# Patient Record
Sex: Male | Born: 1955 | Race: White | Hispanic: No | Smoking: Current every day smoker
Health system: Southern US, Community
[De-identification: ages and names within clinical notes are randomized; demographics above are authoritative.]

## PROBLEM LIST (undated history)

## (undated) DIAGNOSIS — I2510 Atherosclerotic heart disease of native coronary artery without angina pectoris: Secondary | ICD-10-CM

## (undated) DIAGNOSIS — F329 Major depressive disorder, single episode, unspecified: Secondary | ICD-10-CM

## (undated) DIAGNOSIS — F32A Depression, unspecified: Secondary | ICD-10-CM

## (undated) HISTORY — PX: CORONARY ARTERY BYPASS GRAFT: SHX141

---

## 2015-10-04 ENCOUNTER — Emergency Department: Payer: BLUE CROSS/BLUE SHIELD

## 2015-10-04 ENCOUNTER — Inpatient Hospital Stay
Admission: EM | Admit: 2015-10-04 | Discharge: 2015-10-07 | DRG: 885 | Disposition: A | Discharge status: Other Acute Inpatient Hospital | Payer: BLUE CROSS/BLUE SHIELD | Attending: Internal Medicine | Admitting: Internal Medicine

## 2015-10-04 DIAGNOSIS — F1010 Alcohol abuse, uncomplicated: Secondary | ICD-10-CM | POA: Diagnosis present

## 2015-10-04 DIAGNOSIS — R000 Tachycardia, unspecified: Secondary | ICD-10-CM | POA: Diagnosis present

## 2015-10-04 DIAGNOSIS — G2579 Other drug induced movement disorders: Secondary | ICD-10-CM | POA: Diagnosis present

## 2015-10-04 DIAGNOSIS — N179 Acute kidney failure, unspecified: Secondary | ICD-10-CM | POA: Diagnosis present

## 2015-10-04 DIAGNOSIS — T43221A Poisoning by selective serotonin reuptake inhibitors, accidental (unintentional), initial encounter: Secondary | ICD-10-CM | POA: Diagnosis present

## 2015-10-04 DIAGNOSIS — R292 Abnormal reflex: Secondary | ICD-10-CM | POA: Diagnosis present

## 2015-10-04 DIAGNOSIS — Z880 Allergy status to penicillin: Secondary | ICD-10-CM | POA: Diagnosis not present

## 2015-10-04 DIAGNOSIS — Z951 Presence of aortocoronary bypass graft: Secondary | ICD-10-CM | POA: Diagnosis not present

## 2015-10-04 DIAGNOSIS — F3113 Bipolar disorder, current episode manic without psychotic features, severe: Secondary | ICD-10-CM | POA: Diagnosis not present

## 2015-10-04 DIAGNOSIS — E785 Hyperlipidemia, unspecified: Secondary | ICD-10-CM | POA: Diagnosis present

## 2015-10-04 DIAGNOSIS — F319 Bipolar disorder, unspecified: Secondary | ICD-10-CM | POA: Diagnosis not present

## 2015-10-04 DIAGNOSIS — I2510 Atherosclerotic heart disease of native coronary artery without angina pectoris: Secondary | ICD-10-CM | POA: Diagnosis present

## 2015-10-04 DIAGNOSIS — T50901A Poisoning by unspecified drugs, medicaments and biological substances, accidental (unintentional), initial encounter: Secondary | ICD-10-CM

## 2015-10-04 DIAGNOSIS — E876 Hypokalemia: Secondary | ICD-10-CM | POA: Diagnosis present

## 2015-10-04 DIAGNOSIS — E860 Dehydration: Secondary | ICD-10-CM | POA: Diagnosis present

## 2015-10-04 DIAGNOSIS — F29 Unspecified psychosis not due to a substance or known physiological condition: Secondary | ICD-10-CM

## 2015-10-04 DIAGNOSIS — F17200 Nicotine dependence, unspecified, uncomplicated: Secondary | ICD-10-CM | POA: Diagnosis present

## 2015-10-04 DIAGNOSIS — G9081 Serotonin syndrome: Secondary | ICD-10-CM | POA: Diagnosis present

## 2015-10-04 DIAGNOSIS — Z79899 Other long term (current) drug therapy: Secondary | ICD-10-CM | POA: Diagnosis not present

## 2015-10-04 DIAGNOSIS — E7800 Pure hypercholesterolemia, unspecified: Secondary | ICD-10-CM | POA: Diagnosis present

## 2015-10-04 DIAGNOSIS — I10 Essential (primary) hypertension: Secondary | ICD-10-CM | POA: Diagnosis present

## 2015-10-04 HISTORY — DX: Depression, unspecified: F32.A

## 2015-10-04 HISTORY — DX: Atherosclerotic heart disease of native coronary artery without angina pectoris: I25.10

## 2015-10-04 HISTORY — DX: Major depressive disorder, single episode, unspecified: F32.9

## 2015-10-04 LAB — CBC WITH DIFFERENTIAL/PLATELET
BASOS PCT: 0 %
Basophils Absolute: 0.1 10*3/uL (ref 0–0.1)
EOS ABS: 0 10*3/uL (ref 0–0.7)
Eosinophils Relative: 0 %
HCT: 46 % (ref 40.0–52.0)
HEMOGLOBIN: 16 g/dL (ref 13.0–18.0)
Lymphocytes Relative: 5 %
Lymphs Abs: 0.9 10*3/uL — ABNORMAL LOW (ref 1.0–3.6)
MCH: 31.6 pg (ref 26.0–34.0)
MCHC: 34.8 g/dL (ref 32.0–36.0)
MCV: 91 fL (ref 80.0–100.0)
Monocytes Absolute: 1.2 10*3/uL — ABNORMAL HIGH (ref 0.2–1.0)
Monocytes Relative: 7 %
NEUTROS PCT: 88 %
Neutro Abs: 14.6 10*3/uL — ABNORMAL HIGH (ref 1.4–6.5)
PLATELETS: 186 10*3/uL (ref 150–440)
RBC: 5.06 MIL/uL (ref 4.40–5.90)
RDW: 13.6 % (ref 11.5–14.5)
WBC: 16.8 10*3/uL — AB (ref 3.8–10.6)

## 2015-10-04 LAB — URINALYSIS COMPLETE WITH MICROSCOPIC (ARMC ONLY)
Bilirubin Urine: NEGATIVE
GLUCOSE, UA: NEGATIVE mg/dL
Ketones, ur: NEGATIVE mg/dL
NITRITE: NEGATIVE
PH: 5 (ref 5.0–8.0)
PROTEIN: 30 mg/dL — AB
Specific Gravity, Urine: 1.014 (ref 1.005–1.030)

## 2015-10-04 LAB — COMPREHENSIVE METABOLIC PANEL
ALK PHOS: 128 U/L — AB (ref 38–126)
ALT: 39 U/L (ref 17–63)
ANION GAP: 14 (ref 5–15)
AST: 48 U/L — ABNORMAL HIGH (ref 15–41)
Albumin: 5.4 g/dL — ABNORMAL HIGH (ref 3.5–5.0)
BILIRUBIN TOTAL: 2.5 mg/dL — AB (ref 0.3–1.2)
BUN: 30 mg/dL — ABNORMAL HIGH (ref 6–20)
CALCIUM: 10.4 mg/dL — AB (ref 8.9–10.3)
CO2: 24 mmol/L (ref 22–32)
Chloride: 101 mmol/L (ref 101–111)
Creatinine, Ser: 1.91 mg/dL — ABNORMAL HIGH (ref 0.61–1.24)
GFR, EST AFRICAN AMERICAN: 43 mL/min — AB (ref >60)
GFR, EST NON AFRICAN AMERICAN: 37 mL/min — AB (ref >60)
Glucose, Bld: 166 mg/dL — ABNORMAL HIGH (ref 65–99)
Potassium: 2.9 mmol/L — CL (ref 3.5–5.1)
Sodium: 139 mmol/L (ref 135–145)
TOTAL PROTEIN: 8.8 g/dL — AB (ref 6.5–8.1)

## 2015-10-04 LAB — URINE DRUG SCREEN, QUALITATIVE (ARMC ONLY)
AMPHETAMINES, UR SCREEN: NOT DETECTED
BENZODIAZEPINE, UR SCRN: NOT DETECTED
Barbiturates, Ur Screen: NOT DETECTED
CANNABINOID 50 NG, UR ~~LOC~~: NOT DETECTED
Cocaine Metabolite,Ur ~~LOC~~: NOT DETECTED
MDMA (ECSTASY) UR SCREEN: NOT DETECTED
Methadone Scn, Ur: NOT DETECTED
Opiate, Ur Screen: NOT DETECTED
Phencyclidine (PCP) Ur S: NOT DETECTED
TRICYCLIC, UR SCREEN: NOT DETECTED

## 2015-10-04 LAB — ETHANOL: Alcohol, Ethyl (B): <5 mg/dL (ref <5)

## 2015-10-04 LAB — PROTIME-INR
INR: 1.07
PROTHROMBIN TIME: 14.1 s (ref 11.4–15.0)

## 2015-10-04 LAB — SALICYLATE LEVEL

## 2015-10-04 LAB — APTT: aPTT: 34 seconds (ref 24–36)

## 2015-10-04 LAB — CK: Total CK: 167 U/L (ref 49–397)

## 2015-10-04 LAB — AMMONIA: AMMONIA: 26 umol/L (ref 9–35)

## 2015-10-04 LAB — ACETAMINOPHEN LEVEL: Acetaminophen (Tylenol), Serum: <10 ug/mL — ABNORMAL LOW (ref 10–30)

## 2015-10-04 MED ORDER — POTASSIUM CHLORIDE CRYS ER 20 MEQ PO TBCR: 40.0000 meq | EXTENDED_RELEASE_TABLET | Freq: Once | ORAL | Status: DC

## 2015-10-04 MED ORDER — POTASSIUM CHLORIDE CRYS ER 20 MEQ PO TBCR
40.0000 meq | EXTENDED_RELEASE_TABLET | Freq: Once | ORAL | Status: AC
  Administered 2015-10-04: 40 meq via ORAL
  Filled 2015-10-04: qty 2

## 2015-10-04 MED ORDER — ACETAMINOPHEN 325 MG PO TABS
650.0000 mg | ORAL_TABLET | Freq: Once | ORAL | Status: AC
  Administered 2015-10-04: 650 mg via ORAL
  Filled 2015-10-04: qty 2

## 2015-10-04 MED ORDER — LORAZEPAM 2 MG/ML IJ SOLN: 1.0000 mg | Freq: Once | INTRAMUSCULAR | Status: DC

## 2015-10-04 MED ORDER — SODIUM CHLORIDE 0.9 % IV BOLUS (SEPSIS)
500.0000 mL | Freq: Once | INTRAVENOUS | Status: AC
  Administered 2015-10-04: 500 mL via INTRAVENOUS

## 2015-10-04 MED ORDER — POTASSIUM CHLORIDE CRYS ER 20 MEQ PO TBCR
40.0000 meq | EXTENDED_RELEASE_TABLET | Freq: Once | ORAL | Status: DC
  Filled 2015-10-04: qty 2
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
## 2015-10-04 NOTE — ED Notes (Signed)
Assisted pt to a standing position so pt can void. Significant increase in HR noted upon standing (see flowsheet). Alphonzo LemmingsMcShane, MD notified.
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
## 2015-10-05 DIAGNOSIS — F1010 Alcohol abuse, uncomplicated: Secondary | ICD-10-CM

## 2015-10-05 DIAGNOSIS — F3113 Bipolar disorder, current episode manic without psychotic features, severe: Secondary | ICD-10-CM

## 2015-10-05 DIAGNOSIS — T50901A Poisoning by unspecified drugs, medicaments and biological substances, accidental (unintentional), initial encounter: Secondary | ICD-10-CM

## 2015-10-05 LAB — COMPREHENSIVE METABOLIC PANEL
ALK PHOS: 114 U/L (ref 38–126)
ALT: 34 U/L (ref 17–63)
AST: 66 U/L — AB (ref 15–41)
Albumin: 4.6 g/dL (ref 3.5–5.0)
Anion gap: 10 (ref 5–15)
BUN: 30 mg/dL — AB (ref 6–20)
CHLORIDE: 101 mmol/L (ref 101–111)
CO2: 29 mmol/L (ref 22–32)
CREATININE: 1.39 mg/dL — AB (ref 0.61–1.24)
Calcium: 9.2 mg/dL (ref 8.9–10.3)
GFR calc Af Amer: >60 mL/min (ref >60)
GFR, EST NON AFRICAN AMERICAN: 54 mL/min — AB (ref >60)
Glucose, Bld: 167 mg/dL — ABNORMAL HIGH (ref 65–99)
Potassium: 2.8 mmol/L — CL (ref 3.5–5.1)
Sodium: 140 mmol/L (ref 135–145)
Total Bilirubin: 1.8 mg/dL — ABNORMAL HIGH (ref 0.3–1.2)
Total Protein: 7.4 g/dL (ref 6.5–8.1)

## 2015-10-05 LAB — CBC
HEMATOCRIT: 41.3 % (ref 40.0–52.0)
HEMOGLOBIN: 14.3 g/dL (ref 13.0–18.0)
MCH: 32.1 pg (ref 26.0–34.0)
MCHC: 34.7 g/dL (ref 32.0–36.0)
MCV: 92.5 fL (ref 80.0–100.0)
PLATELETS: 157 10*3/uL (ref 150–440)
RBC: 4.46 MIL/uL (ref 4.40–5.90)
RDW: 13.6 % (ref 11.5–14.5)
WBC: 13.9 10*3/uL — AB (ref 3.8–10.6)

## 2015-10-05 LAB — MAGNESIUM: MAGNESIUM: 1.7 mg/dL (ref 1.7–2.4)

## 2015-10-05 LAB — PROTIME-INR
INR: 1.12
Prothrombin Time: 14.6 seconds (ref 11.4–15.0)

## 2015-10-05 LAB — POTASSIUM: Potassium: 3.4 mmol/L — ABNORMAL LOW (ref 3.5–5.1)

## 2015-10-05 LAB — GLUCOSE, CAPILLARY: GLUCOSE-CAPILLARY: 103 mg/dL — AB (ref 65–99)

## 2015-10-05 LAB — APTT: aPTT: 35 seconds (ref 24–36)

## 2015-10-05 MED ORDER — SODIUM CHLORIDE 0.9% FLUSH
3.0000 mL | Freq: Two times a day (BID) | INTRAVENOUS | Status: DC
  Administered 2015-10-05 (×2): 3 mL via INTRAVENOUS

## 2015-10-05 MED ORDER — POTASSIUM CHLORIDE CRYS ER 20 MEQ PO TBCR
40.0000 meq | EXTENDED_RELEASE_TABLET | Freq: Once | ORAL | Status: AC
  Administered 2015-10-05: 40 meq via ORAL
  Filled 2015-10-05: qty 2

## 2015-10-05 MED ORDER — HEPARIN SODIUM (PORCINE) 5000 UNIT/ML IJ SOLN
5000.0000 [IU] | Freq: Three times a day (TID) | INTRAMUSCULAR | Status: DC
  Administered 2015-10-05 – 2015-10-06 (×4): 5000 [IU] via SUBCUTANEOUS
  Filled 2015-10-05 (×4): qty 1

## 2015-10-05 MED ORDER — NAPHAZOLINE-GLYCERIN 0.012-0.2 % OP SOLN
1.0000 [drp] | Freq: Four times a day (QID) | OPHTHALMIC | Status: DC | PRN
  Administered 2015-10-05 – 2015-10-06 (×3): 2 [drp] via OPHTHALMIC
  Administered 2015-10-06: 1 [drp] via OPHTHALMIC
  Filled 2015-10-05: qty 15

## 2015-10-05 MED ORDER — ACETAMINOPHEN 325 MG PO TABS
650.0000 mg | ORAL_TABLET | Freq: Four times a day (QID) | ORAL | Status: DC | PRN
  Administered 2015-10-06: 650 mg via ORAL
  Filled 2015-10-05: qty 2

## 2015-10-05 MED ORDER — POTASSIUM CHLORIDE IN NACL 20-0.9 MEQ/L-% IV SOLN
INTRAVENOUS | Status: DC
  Administered 2015-10-05 – 2015-10-06 (×5): via INTRAVENOUS
  Filled 2015-10-05 (×6): qty 1000

## 2015-10-05 MED ORDER — DOCUSATE SODIUM 100 MG PO CAPS
100.0000 mg | ORAL_CAPSULE | Freq: Two times a day (BID) | ORAL | Status: DC
  Administered 2015-10-05 – 2015-10-06 (×5): 100 mg via ORAL
  Filled 2015-10-05 (×5): qty 1

## 2015-10-05 MED ORDER — ONDANSETRON HCL 4 MG PO TABS: 4.0000 mg | ORAL_TABLET | Freq: Four times a day (QID) | ORAL | Status: DC | PRN

## 2015-10-05 MED ORDER — ACETAMINOPHEN 650 MG RE SUPP: 650.0000 mg | Freq: Four times a day (QID) | RECTAL | Status: DC | PRN

## 2015-10-05 MED ORDER — SODIUM CHLORIDE 0.9 % IV SOLN
INTRAVENOUS | Status: DC
  Administered 2015-10-05: 02:00:00 via INTRAVENOUS

## 2015-10-05 MED ORDER — LORAZEPAM 2 MG/ML IJ SOLN
1.0000 mg | Freq: Four times a day (QID) | INTRAMUSCULAR | Status: DC | PRN
  Administered 2015-10-06 (×3): 1 mg via INTRAVENOUS
  Filled 2015-10-05 (×3): qty 1

## 2015-10-05 MED ORDER — ONDANSETRON HCL 4 MG/2ML IJ SOLN: 4.0000 mg | Freq: Four times a day (QID) | INTRAMUSCULAR | Status: DC | PRN

## 2015-10-05 MED ORDER — POTASSIUM CHLORIDE CRYS ER 20 MEQ PO TBCR
40.0000 meq | EXTENDED_RELEASE_TABLET | Freq: Once | ORAL | Status: AC
Start: 2015-10-05 — End: 2015-10-05
  Administered 2015-10-05: 40 meq via ORAL

## 2015-10-05 NOTE — Consult Note (Signed)
  Psychiatry: Brief note. Full note to follow. 60 year old man presented with a constellation of symptoms that includes elevated pulse blood pressure and temperature although all mildly and all of them resolving, diaphoresis, agitation. Admits to overdosing on Zoloft. Differential diagnosis includes serotonin syndrome, mania, alcohol withdrawal or what I suspect which is that there may be contributions of all 3. Patient currently is hemodynamically stable. We'll continue to monitor. Full note to follow

## 2015-10-05 NOTE — Progress Notes (Signed)
Pt. Complained of watering, itchy eyes, MD notified and orders placed.
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
## 2015-10-05 NOTE — Progress Notes (Signed)
Pt. Potassium critical at 2.9. MD notified and 40 mEq ordered and admin.
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
## 2015-10-05 NOTE — ED Notes (Addendum)
Previous note documented at 0021 was entered in error

## 2015-10-06 LAB — GLUCOSE, CAPILLARY: GLUCOSE-CAPILLARY: 109 mg/dL — AB (ref 65–99)

## 2015-10-06 MED ORDER — ENOXAPARIN SODIUM 40 MG/0.4ML ~~LOC~~ SOLN
40.0000 mg | SUBCUTANEOUS | Status: DC
  Administered 2015-10-06: 40 mg via SUBCUTANEOUS
  Filled 2015-10-06: qty 0.4

## 2015-10-06 MED ORDER — LORAZEPAM 1 MG PO TABS
1.0000 mg | ORAL_TABLET | Freq: Four times a day (QID) | ORAL | Status: DC | PRN
  Administered 2015-10-06: 1 mg via ORAL
  Filled 2015-10-06: qty 1

## 2015-10-06 MED ORDER — DIVALPROEX SODIUM 500 MG PO DR TAB: 500.0000 mg | DELAYED_RELEASE_TABLET | Freq: Two times a day (BID) | ORAL | Status: DC

## 2015-10-06 MED ORDER — ALPRAZOLAM 0.25 MG PO TABS
0.2500 mg | ORAL_TABLET | Freq: Three times a day (TID) | ORAL | Status: AC | PRN
Start: 2015-10-06 — End: ?

## 2015-10-06 MED ORDER — DIVALPROEX SODIUM 500 MG PO DR TAB
500.0000 mg | DELAYED_RELEASE_TABLET | Freq: Two times a day (BID) | ORAL | Status: DC
  Administered 2015-10-06: 500 mg via ORAL
  Filled 2015-10-06: qty 1
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
## 2015-10-06 NOTE — Progress Notes (Signed)
[facility] Dr. Toni Amendlapacs.  Attending Physician will be Dr. Jennet MaduroPucilowska.   Patient [facility]wen.   Intake Paper Work has been signed and placed on patient chart.  BH staff  & Byrd HesselbachMaria Patient Accessis aware of the admission.  10/06/2015 Cheryl FlashNicole Stephan Nelis, MS, NCC, LPCA Therapeutic Triage Specialist
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
## 2015-10-06 NOTE — Progress Notes (Signed)
Pt is medically stable, and as per psych floor- there may be a bed available later tonight.  Discharge med reconciliation done, he will be discharged - [facility], Informed the nurse about that.

## 2015-10-07 ENCOUNTER — Inpatient Hospital Stay
Admission: EM | Admit: 2015-10-07 | Discharge: 2015-10-09 | DRG: 885 | Disposition: A | Discharge status: Home / Self Care | Payer: BLUE CROSS/BLUE SHIELD | Source: Intra-hospital | Attending: Psychiatry | Admitting: Psychiatry

## 2015-10-07 DIAGNOSIS — G4700 Insomnia, unspecified: Secondary | ICD-10-CM | POA: Diagnosis present

## 2015-10-07 DIAGNOSIS — Z951 Presence of aortocoronary bypass graft: Secondary | ICD-10-CM | POA: Diagnosis not present

## 2015-10-07 DIAGNOSIS — E876 Hypokalemia: Secondary | ICD-10-CM | POA: Diagnosis present

## 2015-10-07 DIAGNOSIS — Z9114 Patient's other noncompliance with medication regimen: Secondary | ICD-10-CM | POA: Diagnosis not present

## 2015-10-07 DIAGNOSIS — T50901A Poisoning by unspecified drugs, medicaments and biological substances, accidental (unintentional), initial encounter: Secondary | ICD-10-CM | POA: Diagnosis present

## 2015-10-07 DIAGNOSIS — I2510 Atherosclerotic heart disease of native coronary artery without angina pectoris: Secondary | ICD-10-CM | POA: Diagnosis present

## 2015-10-07 DIAGNOSIS — E781 Pure hyperglyceridemia: Secondary | ICD-10-CM | POA: Diagnosis present

## 2015-10-07 DIAGNOSIS — Z880 Allergy status to penicillin: Secondary | ICD-10-CM | POA: Diagnosis not present

## 2015-10-07 DIAGNOSIS — F29 Unspecified psychosis not due to a substance or known physiological condition: Secondary | ICD-10-CM | POA: Diagnosis present

## 2015-10-07 DIAGNOSIS — F17200 Nicotine dependence, unspecified, uncomplicated: Secondary | ICD-10-CM | POA: Diagnosis present

## 2015-10-07 DIAGNOSIS — F312 Bipolar disorder, current episode manic severe with psychotic features: Secondary | ICD-10-CM | POA: Diagnosis present

## 2015-10-07 DIAGNOSIS — F1010 Alcohol abuse, uncomplicated: Secondary | ICD-10-CM | POA: Diagnosis present

## 2015-10-07 MED ORDER — OXCARBAZEPINE 300 MG PO TABS
300.0000 mg | ORAL_TABLET | Freq: Two times a day (BID) | ORAL | Status: DC
Start: 2015-10-07 — End: 2015-10-09
  Administered 2015-10-07 – 2015-10-09 (×5): 300 mg via ORAL
  Filled 2015-10-07 (×5): qty 1

## 2015-10-07 MED ORDER — TEMAZEPAM 15 MG PO CAPS
15.0000 mg | ORAL_CAPSULE | Freq: Every day | ORAL | Status: DC
  Administered 2015-10-07 – 2015-10-08 (×2): 15 mg via ORAL
  Filled 2015-10-07 (×2): qty 1

## 2015-10-07 MED ORDER — ZIPRASIDONE HCL 40 MG PO CAPS
40.0000 mg | ORAL_CAPSULE | Freq: Once | ORAL | Status: AC
  Administered 2015-10-07: 40 mg via ORAL

## 2015-10-07 MED ORDER — MAGNESIUM HYDROXIDE 400 MG/5ML PO SUSP: 30.0000 mL | Freq: Every day | ORAL | Status: DC | PRN

## 2015-10-07 MED ORDER — ALUM & MAG HYDROXIDE-SIMETH 200-200-20 MG/5ML PO SUSP: 30.0000 mL | ORAL | Status: DC | PRN

## 2015-10-07 MED ORDER — LORAZEPAM 1 MG PO TABS
1.0000 mg | ORAL_TABLET | Freq: Four times a day (QID) | ORAL | Status: DC | PRN
  Administered 2015-10-07 – 2015-10-09 (×6): 1 mg via ORAL
  Filled 2015-10-07 (×6): qty 1

## 2015-10-07 MED ORDER — ACETAMINOPHEN 325 MG PO TABS: 650.0000 mg | ORAL_TABLET | Freq: Four times a day (QID) | ORAL | Status: DC | PRN

## 2015-10-07 MED ORDER — ZIPRASIDONE HCL 40 MG PO CAPS
40.0000 mg | ORAL_CAPSULE | Freq: Two times a day (BID) | ORAL | Status: DC
  Administered 2015-10-08: 40 mg via ORAL
  Filled 2015-10-07: qty 1

## 2015-10-07 MED ORDER — ZIPRASIDONE HCL 40 MG PO CAPS
40.0000 mg | ORAL_CAPSULE | Freq: Two times a day (BID) | ORAL | Status: DC
  Filled 2015-10-07: qty 1

## 2015-10-07 NOTE — Tx Team (Signed)
Initial Interdisciplinary Treatment Plan   PATIENT STRESSORS: Health problems Medication change or noncompliance Substance abuse   PATIENT STRENGTHS: General fund of knowledge Religious Affiliation   PROBLEM LIST: Problem List/Patient Goals Date to be addressed Date deferred Reason deferred Estimated date of resolution  Psychosis 10/07/15     "I'm doing what I need to do for God' 10/07/15                                                DISCHARGE CRITERIA:  Improved stabilization in mood, thinking, and/or behavior  PRELIMINARY DISCHARGE PLAN: Outpatient therapy  PATIENT/FAMIILY INVOLVEMENT: This treatment plan has been presented to and reviewed with the patient, Christian Craig, and/or family member.  The patient and family have been given the opportunity to ask questions and make suggestions.  Christian Craig 10/07/2015, 3:19 AM
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
## 2015-10-07 NOTE — Progress Notes (Signed)
Pt has been discharged for 2C. IV and central monitoring was removed. Skin is intact, VSS. Pt was educated about discharge and his hospitalization stay. Nursing superisvor was notified about pt being discharge. Security was called. Pt will be will escorted to behavioral health with security, Bonita QuinLinda (pt.'s sitter on 2C) and Yehuda SavannahLauren Hobbs, RN. Involuntary commitment papers were handed to the nurse taking care of the pt in behavioral health.    Karsten RoLauren E Hobbs

## 2015-10-07 NOTE — Progress Notes (Signed)
Pt in bed eyes closed, resting, reports sleepy

## 2015-10-07 NOTE — [facility] Group Notes (Signed)
[facility] LCSW Group Therapy  10/07/2015 3:10 PM  Type of Therapy:  Group Therapy  Participation Level:  Did Not Attend  Modes of Intervention:  Discussion, Education, Socialization and Support  Summary of Progress/Problems: Self esteem: Patients discussed self esteem and how it impacts them. They discussed what aspects in their lives has influenced their self esteem. They were challenged to identify changes that are needed in order to improve self esteem.    Maeson Purohit L Clevon Khader MSW, LCSWA  10/07/2015, 3:10 PM   

## 2015-10-07 NOTE — Progress Notes (Signed)
Pt presented this am flat, irritable. Reported being sleepy. Did not sleep last night. Pt offered minimal communication. Safety sitter discontinued. Pt requested ativan for anxiety. Given with good relief. Encouragement and support offered.  Pt currently in bed resting eyes closed. Remains safe on unit with q15 min checks

## 2015-10-07 NOTE — [facility] Suicide Risk Assessment (Signed)
[facility]   Nursing information obtained from:  Patient Demographic factors:  Divorced or widowed, Living alone, Access to firearms, Caucasian Current Mental Status:  NA Loss Factors:  NA Historical Factors:  Impulsivity Risk Reduction Factors:  Religious beliefs about death, Employed  Total Time spent with patient: 1 hour Principal Problem: Bipolar I disorder, most recent episode manic, severe with psychotic features (HCC) Diagnosis:   Patient Active Problem List   Diagnosis Date Noted  . Bipolar I disorder, most recent episode manic, severe with psychotic features (HCC) [F31.2] 10/07/2015  . Tobacco use disorder [F17.200] 10/07/2015  . Alcohol abuse [F10.10] 10/05/2015  . Overdose of medication [T50.901A] 10/05/2015   Subjective Data: Insomnia.  Continued Clinical Symptoms:  Alcohol Use Disorder Identification Test Final Score (AUDIT): 12 The "Alcohol Use Disorders Identification Test", Guidelines for Use in Primary Care, Second Edition.  World Science writerHealth Organization [facility](WHO). Score between 0-7:  no or low risk or alcohol related problems. Score between 8-15:  moderate risk of alcohol related problems. Score between 16-19:  high risk of alcohol related problems. Score 20 or above:  warrants further diagnostic evaluation for alcohol dependence and treatment.   CLINICAL FACTORS:   Bipolar Disorder:   Mixed State Currently Psychotic   Musculoskeletal: Strength & Muscle Tone: within normal limits Gait & Station: normal Patient leans: N/A  Psychiatric Specialty Exam: Review of Systems  Psychiatric/Behavioral: The patient has insomnia.   All other systems reviewed and are negative.   Blood pressure 124/65, pulse 74, temperature 98.5 F (36.9 C), temperature source Oral, resp. rate 20, height 5' 10.28" (1.785 m), weight 96.8 kg (213 lb 6.5 oz), SpO2 100 %.Body mass index is 30.38 kg/(m^2).  General Appearance: Disheveled  Eye Contact::  Minimal   Speech:  Pressured  Volume:  Increased  Mood:  Angry, Dysphoric and Irritable  Affect:  Labile  Thought Process:  Disorganized  Orientation:  Full (Time, Place, and Person)  Thought Content:  Delusions, Hallucinations: Auditory and Paranoid Ideation  Suicidal Thoughts:  No  Homicidal Thoughts:  No  Memory:  Immediate;   Fair Recent;   Fair Remote;   Fair  Judgement:  Poor  Insight:  Lacking  Psychomotor Activity:  Increased  Concentration:  Fair  Recall:  FiservFair  Fund of Knowledge:Fair  Language: Fair  Akathisia:  No  Handed:  Right  AIMS (if indicated):     Assets:  ArchitectCommunication Skills Financial Resources/Insurance Housing Physical Health  Sleep:  Number of Hours: 3  Cognition: WNL  ADL's:  Intact    COGNITIVE FEATURES THAT CONTRIBUTE TO RISK:  None    SUICIDE RISK:   Moderate:  Frequent suicidal ideation with limited intensity, and duration, some specificity in terms of plans, no associated intent, good self-control, limited dysphoria/symptomatology, some risk factors present, and identifiable protective factors, including available and accessible social support.  PLAN OF CARE: Hospital admission, medication management, discharge planning.  Mr. Christian Craig is a 60 year old male with a history of bipolar disorder transferred from medical floor where he was briefly hospitalized for altered mental status and who is floridly psychotic in the context of medication noncompliance.  1. Mood and psychosis. He refuses Depakote. Will start Geodon 40 mg twice daily and Trileptal 300 mg twice daily for psychosis and mood stabilization. We will offer additional Ativan if needed for anxiety.  2. Insomnia. We'll start Restoril 15 mg tonight.  3 metabolic syndrome screening. Lipid profile, hemoglobin A1c, TSH and prolactin are pending.   4. Hypokalemia.  Resolved.  5. Disposition. The patient will be discharged to home with family. He will follow-up with a local provider in [place].     I certify that inpatient services furnished can reasonably be expected to improve the patient's condition.   Kristine Linea, MD 10/07/2015, 12:55 PM

## 2015-10-07 NOTE — Progress Notes (Signed)
Patient in bed, eyes closed.

## 2015-10-07 NOTE — Plan of Care (Signed)
Problem: Alteration in thought process Goal: STG-Patient is able to discuss thoughts with staff Outcome: Not Progressing Patient will not discuss, reports he is sleepy and wants to sleep
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
## 2015-10-07 NOTE — Progress Notes (Signed)
Recreation Therapy Notes  Date: 04.19.17 Time: 9:30 am Location: Craft Room  Group Topic: Self-esteem  Goal Area(s) Addresses:  Patient will write at least one positive trait. Patient will verbalize benefit of having healthy self-esteem.  Behavioral Response: Did not attend  Intervention: I Am  Activity: Patients were given a worksheet with the letter I on it and instructed to write as many positive traits about themselves inside the letter.  Education: LRT educated patients on ways they can increase their self-esteem.  Education Outcome: Patient did not attend group.  Clinical Observations/Feedback: Patient did not attend group.  Angelisa Winthrop M, LRT/CTRS 10/07/2015 9:50 AM 

## 2015-10-07 NOTE — [facility] Group Notes (Signed)
[facility] Group Notes:  (Nursing/MHT/Case Management/Adjunct)  Date:  10/07/2015  Time:  5:08 PM  Type of Therapy:  Psychoeducational Skills  Participation Level:  Did Not Attend    Mickey Farberamela M Moses Ellison 10/07/2015, 5:08 PM

## 2015-10-07 NOTE — Progress Notes (Signed)
Pt in bed resting, sitter at bedside for safety

## 2015-10-07 NOTE — Progress Notes (Signed)
Patient ate breakfast, in room resting 1:1 sitter for safety.

## 2015-10-07 NOTE — Progress Notes (Signed)
Patient in bed resting, sitter at bedside for safety. 

## 2015-10-07 NOTE — Progress Notes (Signed)
Patient in dayroom eating lunch.

## 2015-10-08 LAB — LIPID PANEL
Cholesterol: 162 mg/dL (ref 0–200)
HDL: 52 mg/dL (ref >40)
LDL CALC: 79 mg/dL (ref 0–99)
Total CHOL/HDL Ratio: 3.1 RATIO
Triglycerides: 156 mg/dL — ABNORMAL HIGH (ref <150)
VLDL: 31 mg/dL (ref 0–40)

## 2015-10-08 LAB — TSH: TSH: 1.92 u[IU]/mL (ref 0.350–4.500)

## 2015-10-08 LAB — HEMOGLOBIN A1C: HEMOGLOBIN A1C: 5.5 % (ref 4.0–6.0)

## 2015-10-08 MED ORDER — ZIPRASIDONE HCL 80 MG PO CAPS: 80.0000 mg | ORAL_CAPSULE | Freq: Two times a day (BID) | ORAL | Status: AC

## 2015-10-08 MED ORDER — TEMAZEPAM 15 MG PO CAPS: 15.0000 mg | ORAL_CAPSULE | Freq: Every day | ORAL | Status: AC

## 2015-10-08 MED ORDER — OXCARBAZEPINE 300 MG PO TABS: 300.0000 mg | ORAL_TABLET | Freq: Two times a day (BID) | ORAL | Status: AC

## 2015-10-08 MED ORDER — NICOTINE 21 MG/24HR TD PT24: 21.0000 mg | MEDICATED_PATCH | Freq: Every day | TRANSDERMAL | Status: DC

## 2015-10-08 MED ORDER — ZIPRASIDONE HCL 40 MG PO CAPS
80.0000 mg | ORAL_CAPSULE | Freq: Two times a day (BID) | ORAL | Status: DC
  Administered 2015-10-08 – 2015-10-09 (×2): 80 mg via ORAL
  Filled 2015-10-08 (×2): qty 2

## 2015-10-08 NOTE — Progress Notes (Signed)
D:  Per pt self inventory pt reports sleeping good, appetite good, energy level normal, ability to pay attention good, rates depression at a 1 out of 10, hopelessness at a 0 out of 10, anxiety at a 1 out of 10, denies SI/HI/AVH, goal today: "find my life again, do the best I can", pt is pleasant and bright during interactions, states that he feels like the medications are working well for him.   A:  Emotional support provided, Encouraged pt to continue with treatment plan and attend all group activities, q15 min checks maintained for safety.  R:  Pt is receptive, going to some groups, pleasant and cooperative with staff and other patients on the unit.

## 2015-10-08 NOTE — Progress Notes (Signed)
Pt signed consent to release info to daughter and son, Spoke with daughter Sheppard PentonMichele Bronnenberg via phone call, per Dr. Jennet MaduroPucilowska pt will be discharged in 1-2 days, per daughter Elon JesterMichele pt is from [place] and transportation at discharge will need to be arranged with daughter and son Virgil BenedictDaniel Marschall II, they may not be able to pick patient up until Saturday, Daughter michele's [phone], Shara BlazingSon Dakari [phone].

## 2015-10-08 NOTE — Progress Notes (Signed)
[facility] MD Progress Note  10/08/2015 12:37 PM Christian Craig  MRN:  [id number]  Subjective:  Christian Craig reports feeling much better today. He slept through the night, for the first time in 5 or 6 days. His thinking is much more clear. He is able to have normal conversation. Given his walking is better he claims. He denies any psychotic symptoms no hallucinations, delusions or paranoia. He is no longer preoccupied with religious themes or at least is not compared to talk about it. He accepts medications and tolerates them well. There are no somatic complaints.  Principal Problem: Bipolar I disorder, most recent episode manic, severe with psychotic features (HCC) Diagnosis:   Patient Active Problem List   Diagnosis Date Noted  . Bipolar I disorder, most recent episode manic, severe with psychotic features (HCC) [F31.2] 10/07/2015  . Tobacco use disorder [F17.200] 10/07/2015  . Alcohol abuse [F10.10] 10/05/2015  . Overdose of medication [T50.901A] 10/05/2015   Total Time spent with patient: 20 minutes  Past Psychiatric History: Bipolar disorder.  Past Medical History:  Past Medical History  Diagnosis Date  . Coronary artery disease   . Depression     Past Surgical History  Procedure Laterality Date  . Coronary artery bypass graft     Family History: History reviewed. No pertinent family history. Family Psychiatric  History: None reported. Social History:  History  Alcohol Use  . Yes     History  Drug Use Not on file    Social History   Social History  . Marital Status: Widowed    Spouse Name: N/A  . Number of Children: N/A  . Years of Education: N/A   Social History Main Topics  . Smoking status: Current Every Day Smoker  . Smokeless tobacco: None  . Alcohol Use: Yes  . Drug Use: None  . Sexual Activity: Not Asked   Other Topics Concern  . None   Social History Narrative   Additional Social History:                         Sleep: Fair  Appetite:   Fair  Current Medications: Current Facility-Administered Medications  Medication Dose Route Frequency Provider Last Rate Last Dose  . acetaminophen (TYLENOL) tablet 650 mg  650 mg Oral Q6H PRN Audery Amel, MD      . alum & mag hydroxide-simeth (MAALOX/MYLANTA) 200-200-20 MG/5ML suspension 30 mL  30 mL Oral Q4H PRN Audery Amel, MD      . LORazepam (ATIVAN) tablet 1 mg  1 mg Oral Q6H PRN Shari Prows, MD   1 mg at 10/08/15 0649  . magnesium hydroxide (MILK OF MAGNESIA) suspension 30 mL  30 mL Oral Daily PRN Audery Amel, MD      . Oxcarbazepine (TRILEPTAL) tablet 300 mg  300 mg Oral BID Shari Prows, MD   300 mg at 10/08/15 0818  . temazepam (RESTORIL) capsule 15 mg  15 mg Oral QHS Shari Prows, MD   15 mg at 10/07/15 2150  . ziprasidone (GEODON) capsule 80 mg  80 mg Oral BID WC Itzae Mccurdy B Nickoli Bagheri, MD        Lab Results:  Results for orders placed or performed during the hospital encounter of 10/07/15 (from the past 48 hour(s))  Lipid panel     Status: Abnormal   Collection Time: 10/08/15  8:49 AM  Result Value Ref Range   Cholesterol 162 0 - 200 mg/dL  Triglycerides 156 (H) <150 mg/dL   HDL 52 >81>40 mg/dL   Total CHOL/HDL Ratio 3.1 RATIO   VLDL 31 0 - 40 mg/dL   LDL Cholesterol 79 0 - 99 mg/dL    Comment:        Total Cholesterol/HDL:CHD Risk Coronary Heart Disease Risk Table                     Men   Women  1/2 Average Risk   3.4   3.3  Average Risk       5.0   4.4  2 X Average Risk   9.6   7.1  3 X Average Risk  23.4   11.0        Use the calculated Patient Ratio above and the CHD Risk Table to determine the patient's CHD Risk.        ATP III CLASSIFICATION (LDL):  <100     mg/dL   Optimal  191-478100-129  mg/dL   Near or Above                    Optimal  130-159  mg/dL   Borderline  [id number]  mg/dL   High  >308>190     mg/dL   Very High   TSH     Status: None   Collection Time: 10/08/15  8:49 AM  Result Value Ref Range   TSH 1.920 0.350 - 4.500  uIU/mL    Blood Alcohol level:  Lab Results  Component Value Date   ETH <5 10/04/2015    Physical Findings: AIMS:  , ,  ,  , Dental Status Current problems with teeth and/or dentures?: No Does patient usually wear dentures?: No  CIWA:    COWS:     Musculoskeletal: Strength & Muscle Tone: within normal limits Gait & Station: normal Patient leans: N/A  Psychiatric Specialty Exam: Review of Systems  All other systems reviewed and are negative.   Blood pressure 130/75, pulse 84, temperature 98 F (36.7 C), temperature source Oral, resp. rate 20, height 5' 10.28" (1.785 m), weight 96.8 kg (213 lb 6.5 oz), SpO2 100 %.Body mass index is 30.38 kg/(m^2).  General Appearance: Casual  Eye Contact::  Good  Speech:  Clear and Coherent  Volume:  Normal  Mood:  Anxious  Affect:  Appropriate  Thought Process:  Goal Directed  Orientation:  Full (Time, Place, and Person)  Thought Content:  Delusions and Paranoid Ideation  Suicidal Thoughts:  No  Homicidal Thoughts:  No  Memory:  Immediate;   Fair Recent;   Fair Remote;   Fair  Judgement:  Poor  Insight:  Lacking  Psychomotor Activity:  Normal  Concentration:  Fair  Recall:  Fis[employer]Fair  Fund of Knowledge:Fair  Language: Fair  Akathisia:  No  Handed:  Right  AIMS (if indicated):     Assets:  Communication Skills Desire for Improvement Financial Resources/Insurance Housing Physical Health Resilience Social Support Talents/Skills Transportation Vocational/Educational  ADL's:  Intact  Cognition: WNL  Sleep:  Number of Hours: 6.25   Treatment Plan Summary: Daily contact with patient to assess and evaluate symptoms and progress in treatment and Medication management   Christian Craig is a 60 year old male with a history of bipolar disorder transferred from medical floor where he was briefly hospitalized for altered mental status and who is floridly psychotic in the context of medication noncompliance.  1. Mood and psychosis. He  refuses Depakote. We started Geodon for psychosis. I  will increase it to 80 mg twice daily. We started Trileptal 300 mg twice daily for mood stabilization. We offered additional Ativan if needed for anxiety.  2. Insomnia. He responded well to Restoril 15 mg tonight.  3 metabolic syndrome screening. Lipid profile shows slightly elevated triglycerides. The patient wants to address it with diet first. Hemoglobin A1c, TSH and prolactin are pending.   4. Hypokalemia. Resolved.  5. Disposition. The patient will be discharged to home with family. He will follow-up with a local provider in [place].   Kristine Linea, MD 10/08/2015, 12:37 PM

## 2015-10-08 NOTE — Progress Notes (Signed)
D: Observed pt in room sleeping. Patient alert and oriented x4. Patient denies SI/HI/AVH. Pt affect is anxious. When asked about mood pt stated "I feel better." Pt sat with Clinical research associatewriter and talked briefly about feeling better mood and physically. Pt indicated that extra sleep is the source of his improvement, as he stated "I went 4-5 days without sleep." Pt rated depression 0/10 and anxiety 5/10. Pt indicated he has not heard any voices since he has arrived. Pt asking for anxiety and sleep medication. A: Offered active listening and support. Provided therapeutic communication. Administered scheduled medications. Gave ativan prn for anxiety.  R: Pt pleasant and cooperative. Pt had no further complaints. Pt medication compliant. Will continue Q15 min. checks. Safety maintained.

## 2015-10-08 NOTE — [facility] Group Notes (Signed)
[facility] LCSW Group Therapy  10/08/2015 4:16 PM  Type of Therapy:  Group Therapy  Participation Level:  Active  Participation Quality:  Attentive  Affect:  Appropriate  Cognitive:  Alert  Insight:  Improving  Engagement in Therapy:  Improving  Modes of Intervention:  Discussion, Education, Socialization and Support  Summary of Progress/Problems: Balance in life: Patients will discuss the concept of balance and how it looks and feels to be unbalanced. Pt will identify areas in their life that is unbalanced and ways to become more balanced.  Pt attended group and stayed the entire time. He discussed the importance of his faith.   Sempra EnergyCandace L Ivelis Norgard MSW, LCSWA  10/08/2015, 4:16 PM
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
## 2015-10-08 NOTE — Progress Notes (Signed)
Recreation Therapy Notes  Date: 04.20.17 Time: 9:30 am Location: Craft Room  Group Topic: Leisure Education  Goal Area(s) Addresses:  Patient will identify things they are grateful for. Patient will identify why it is important to remember the things they are grateful for.  Behavioral Response: Did not attend  Intervention: Grateful Wheel  Activity: Patients were given an I Am Grateful For worksheet and instructed to write 1-2 things they are grateful for under each category.  Education: LRT educated patients on why it is important to be grateful.  Education Outcome: Patient did not attend group.  Clinical Observations/Feedback: Patient did not attend group.  Marcelene Weidemann M, LRT/CTRS 10/08/2015 10:13 AM 

## 2015-10-08 NOTE — [facility] Suicide Risk Assessment (Addendum)
[facility]   Principal Problem: Bipolar I disorder, most recent episode manic, severe with psychotic features [facility](HCC) Discharge Diagnoses:  Patient Active Problem List   Diagnosis Date Noted  . Bipolar I disorder, most recent episode manic, severe with psychotic features (HCC) [F31.2] 10/07/2015  . Tobacco use disorder [F17.200] 10/07/2015  . Alcohol abuse [F10.10] 10/05/2015  . Overdose of medication [T50.901A] 10/05/2015    Total Time spent with patient: 30 minutes  Musculoskeletal: Strength & Muscle Tone: within normal limits Gait & Station: normal Patient leans: N/A  Psychiatric Specialty Exam: Review of Systems  All other systems reviewed and are negative.   Blood pressure 130/75, pulse 84, temperature 98 F (36.7 C), temperature source Oral, resp. rate 20, height 5' 10.28" (1.785 m), weight 96.8 kg (213 lb 6.5 oz), SpO2 100 %.Body mass index is 30.38 kg/(m^2).  General Appearance: Casual  Eye Contact::  Good  Speech:  Clear and Coherent409  Volume:  Normal  Mood:  Dysphoric  Affect:  Appropriate  Thought Process:  Goal Directed  Orientation:  Full (Time, Place, and Person)  Thought Content:  WDL  Suicidal Thoughts:  No  Homicidal Thoughts:  No  Memory:  Immediate;   Fair Recent;   Fair Remote;   Fair  Judgement:  Impaired  Insight:  Shallow  Psychomotor Activity:  Normal  Concentration:  Fair  Recall:  FiservFair  Fund of Knowledge:Fair  Language: Fair  Akathisia:  No  Handed:  Right  AIMS (if indicated):     Assets:  Communication Skills Desire for Improvement Financial Resources/Insurance Housing Physical Health Resilience Social Support Talents/Skills Transportation Vocational/Educational  Sleep:  Number of Hours: 6.25  Cognition: WNL  ADL's:  Intact   Mental Status Per Nursing Assessment::   On Admission:  NA  Demographic Factors:  Male, Divorced or widowed, Caucasian and Living alone  Loss Factors: NA  Historical  Factors: Impulsivity  Risk Reduction Factors:   Sense of responsibility to family, Religious beliefs about death, Employed, Positive social support and Positive therapeutic relationship  Continued Clinical Symptoms:  Bipolar Disorder:   Mixed State  Cognitive Features That Contribute To Risk:  None    Suicide Risk:  Minimal: No identifiable suicidal ideation.  Patients presenting with no risk factors but with morbid ruminations; may be classified as minimal risk based on the severity of the depressive symptoms    Plan Of Care/Follow-up recommendations:  Activity:  As tolerated. Diet:  Low sodium heart healthy. Other:  Keep follow-up appointments.  Kristine LineaJolanta Pucilowska, MD 10/08/2015, 4:49 PM

## 2015-10-08 NOTE — Plan of Care (Signed)
Problem: Alteration in thought process Goal: STG-Patient is able to discuss thoughts with staff Outcome: Progressing After waking up, pt discussed thoughts and feelings with Clinical research associatewriter.

## 2015-10-08 NOTE — [facility] Group Notes (Signed)
[facility] Group Notes:  (Nursing/MHT/Case Management/Adjunct)  Date:  10/08/2015  Time:  6:25 PM  Type of Therapy:  Music Therapy  Participation Level:  Did Not Attend  Christian Craig 10/08/2015, 6:25 PM

## 2015-10-09 LAB — PROLACTIN: PROLACTIN: 9.5 ng/mL (ref 4.0–15.2)

## 2015-10-09 NOTE — Progress Notes (Signed)
Recreation Therapy Notes  Date: 04.21.17 Time: 1:00 pm Location: Craft Room  Group Topic: Self-expression, Coping skills  Goal Area(s) Addresses:  Patient will effectively use art as a means of self-expression. Patient will recognize positive benefit of self-expression. Patient will be able to identify one emotion experienced during group. Patient will identify use of art as a coping skill.  Behavioral Response: Did not attend  Intervention: Two Faces of Me  Activity: Patients were given a blank face worksheet and instructed to draw a line down the middle. On one side of the face, patients were instructed to draw or write how they felt when they were admitted to the hospital. On the other side of the face, patients were instructed to draw or write how they want to feel when they are d/c.  Education: LRT educated patients on different forms of self-expression.  Education Outcome: Patient did not attend group.  Clinical Observations/Feedback: Patient did not attend group.  Jacquelynn CreeGreene,Sheyna Pettibone M, LRT/CTRS 10/09/2015 2:04 PM

## 2015-10-09 NOTE — Progress Notes (Signed)
  [facility] Adult Case Management Discharge Plan :  Will you be returning to the same living situation after discharge:  Yes,  home to [place] At discharge, do you have transportation home?: Yes,  patient's family will pick up Do you have the ability to pay for your medications: Yes,  patient has insurance  Release of information consent forms completed and in the chart;  Patient's signature needed at discharge.  Patient to Follow up at: Follow-up Information    [facility].   Why:  For follow-up care appt Thursday 10/15/15 at 2:15pm.   Contact information:   [address] [place], [place] [id number] Ph [phone] Fax [phone]      Next level of care provider [facility] Link:no  Safety Planning and Suicide Prevention discussed: Yes,  SPE discussed with patient and Christian Craig (daughter) [phone]  Have you used any form of tobacco in the last 30 days? (Cigarettes, Smokeless Tobacco, Cigars, and/or Pipes): Yes  Has patient been referred to the Quitline?: Patient refused referral  Patient has been referred for addiction treatment: Yes, outpatient   Christian RidingIngle, Christian Craig, MSW, LCSW 10/09/2015, 2:58 PM [phone]
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
## 2015-10-09 NOTE — [facility] Group Notes (Signed)
[facility] LCSW Group Therapy  10/09/2015 2:24 PM  Type of Therapy:  Group Therapy  Participation Level:  Minimal  Participation Quality:  Attentive   Affect:  Depressed  Cognitive: Alert   Insight:  Limited  Engagement in Therapy:  Limited  Modes of Intervention:  Discussion, Education, Socialization and Support  Summary of Progress/Problems: Feelings around Relapse. Group members discussed the meaning of relapse and shared personal stories of relapse, how it affected them and others, and how they perceived themselves during this time. Group members were encouraged to identify triggers, warning signs and coping skills used when facing the possibility of relapse. Social supports were discussed and explored in detail. Pt attended group and stayed the entire time. Pt sat quietly and listened to other group members share.   Alayne Estrella L Kymani Shimabukuro MSW, LCSWA  10/09/2015, 2:24 PM   

## 2015-10-09 NOTE — Plan of Care (Signed)
Problem: Alteration in thought process Goal: LTG-Patient behavior demonstrates decreased signs psychosis (Patient behavior demonstrates decreased signs of psychosis to the point the patient is safe to return home and continue treatment in an outpatient setting.)  Outcome: Progressing Pt showing no signs of psychosis this pm

## 2015-10-09 NOTE — [facility] Suicide Risk Assessment (Signed)
[facility] INPATIENT:  Family/Significant Other Suicide Prevention Education  Suicide Prevention Education:  Education Completed; Christian Craig (daughter) [phone] has been identified by the patient as the family member/significant other with whom the patient will be residing, and identified as the person(s) who will aid the patient in the event of a mental health crisis (suicidal ideations/suicide attempt).  With written consent from the patient, the family member/significant other has been provided the following suicide prevention education, prior to the and/or following the discharge of the patient.  The suicide prevention education provided includes the following:  Suicide risk factors  Suicide prevention and interventions  National Suicide Hotline telephone number  [facility] assessment telephone number  [facility] Emergency Assistance 911  [facility] and/or Residential Mobile Crisis Unit telephone number  Request made of family/significant other to:  Remove weapons (e.g., guns, rifles, knives), all items previously/currently identified as safety concern.    Remove drugs/medications (over-the-counter, prescriptions, illicit drugs), all items previously/currently identified as a safety concern.  The family member/significant other verbalizes understanding of the suicide prevention education information provided.  The family member/significant other agrees to remove the items of safety concern listed above.  Christian Craig, Christian Craig, MSW, LCSW 10/09/2015, 2:48 PM

## 2015-10-09 NOTE — Progress Notes (Signed)
Patient denies SI/HI, denies A/V hallucinations. Patient verbalizes understanding of discharge instructions, follow up care and prescriptions. Patient given all belongings from  locker. Patient escorted out by staff, transported by family. 

## 2015-10-09 NOTE — Progress Notes (Signed)
D: Observed pt in room resting. Patient alert and oriented x4. Patient denies SI/HI/AVH. Pt affect is anxious. When asked about mood pt stated "I feel good...positive." Pt indicated that he was looking forward to discharge, and son or daughter would probably be picking him up tomorrow. Pt indicated that he's still "focused on getting caught up with sleep." Pt rated depression 1/10 and anxiety 1/10. Pt indicated he has still not heard any voices since he has arrived. Pt asking for anxiety medication. A: Offered active listening and support. Provided therapeutic communication. Administered scheduled medications. Gave ativan prn for anxiety.  R: Pt pleasant and cooperative. Pt had no further complaints. Pt medication compliant. Will continue Q15 min. checks. Safety maintained.

## 2015-10-09 NOTE — Tx Team (Signed)
Interdisciplinary Treatment Plan Update (Adult)  Date:  10/09/2015 Time Reviewed:  2:54 PM  Progress in Treatment: Attending groups: Yes. Participating in groups:  Yes. Taking medication as prescribed:  Yes. Tolerating medication:  Yes. Family/Significant othe contact made:  No, will contact:  patient's son or daughter Patient understands diagnosis:  Yes. Discussing patient identified problems/goals with staff:  Yes. Medical problems stabilized or resolved:  Yes. Denies suicidal/homicidal ideation: Yes. Issues/concerns per patient self-inventory:  Yes. Other:  New problem(s) identified: No, Describe:  none reported  Discharge Plan or Barriers: Patient will stabilize on medication and discharge home to [place] and follow up with outpatient mental health provider near [place], [place].   Reason for Continuation of Hospitalization: Mania Medication stabilization Other; describe bizarre behavior and hyper religious  Comments:  Estimated length of stay: up to 1 day, expected discharge Friday 10/09/15  New goal(s):  Review of initial/current patient goals per problem list:   1.  Goal(s): Participate in aftercare plan    Met:  No  Target date: by discharge  As evidenced by: patient will participate in aftercare plan AEB aftercare provider and housing plan identified at discharge 10/08/15: Patient can return home once stabilized on medications and will need hospital follow up identified by discharge.   2.  Goal (s): Decrease mania    Met:  No  Target date: by discharge  As evidenced by: patient demonstrates decreased symptoms of mania 10/08/15: Patient will continue to be monitored for symptoms of Mania and is on medications for Bipolar disorder.   3.  Goal(s): Decrease psychosis  Met:  No  Target date: by discharge  As evidenced by: patient demonstrates decreased symptoms of psychosis 10/08/15: Patient will continue to be monitored for symptoms of psychosis  but this appears to be clear at this time.    Attendees: Patient:  Christian Craig 4/21/20172:54 PM  Physician:  Orson Slick, MD 4/21/20172:54 PM  Nursing:   Elige Radon, RN 4/21/20172:54 PM  Other:  Carmell Austria, LCSW 4/21/20172:54 PM  Other:  Bonnye Fava, [place] 4/21/20172:54 PM  Other:  Everitt Amber, [place] 4/21/20172:54 PM  Other: Garald Braver, Psych D 4/21/20172:54 PM  Other:  4/21/20172:54 PM  Other:  4/21/20172:54 PM  Other:  4/21/20172:54 PM  Other:  4/21/20172:54 PM  Other:  4/21/20172:54 PM  Other:   4/21/20172:54 PM   Scribe for Treatment Team:   Keene Breath,, MSW, LCSW  10/09/2015, 2:54 PM

## 2015-10-09 NOTE — Tx Team (Signed)
Interdisciplinary Treatment Plan Update (Adult)  Date:  10/09/2015 Time Reviewed:  3:06 PM  Progress in Treatment: Attending groups: Yes. Participating in groups:  Yes. Taking medication as prescribed:  Yes. Tolerating medication:  Yes. Family/Significant othe contact made:  Yes, individual(s) contacted:  patient's daughter Patient understands diagnosis:  Yes. Discussing patient identified problems/goals with staff:  Yes. Medical problems stabilized or resolved:  Yes. Denies suicidal/homicidal ideation: Yes. Issues/concerns per patient self-inventory:  Yes. Other:  New problem(s) identified: No, Describe:  none reported  Discharge Plan or Barriers: Patient will stabilize on medication and discharge home to [place] and follow up with outpatient mental health provider near [place], [place].   Reason for Continuation of Hospitalization: Mania Medication stabilization Other; describe bizarre behavior and hyper religious  Comments:  Estimated length of stay: 0 days, will discharge today Friday 10/09/15  New goal(s):  Review of initial/current patient goals per problem list:   1.  Goal(s): Participate in aftercare plan    Met:  Yes  Target date: by discharge  As evidenced by: patient will participate in aftercare plan AEB aftercare provider and housing plan identified at discharge 10/08/15: Patient can return home once stabilized on medications and will need hospital follow up identified by discharge.  10/09/15: Patient will discharge home with family and follow up with outpatient mental health provider in [place], [place]. Goal met.   2.  Goal (s): Decrease mania    Met:  Yes  Target date: by discharge  As evidenced by: patient demonstrates decreased symptoms of mania 10/08/15: Patient will continue to be monitored for symptoms of Mania and is on medications for Bipolar disorder.  10/09/15: Patient is stable for discharge per MD.   3.  Goal(s): Decrease  psychosis  Met:  Yes  Target date: by discharge  As evidenced by: patient demonstrates decreased symptoms of psychosis 10/08/15: Patient will continue to be monitored for symptoms of psychosis but this appears to be clear at this time.  10/09/15: Patient is stable for discharge per MD.   Attendees: Physician:  Orson Slick, MD 4/21/20173:06 PM  Nursing:   Tyler Pita, RN 4/21/20173:06 PM  Other:  Carmell Austria, LCSW 4/21/20173:06 PM  Other:  4/21/20173:06 PM  Other:  4/21/20173:06 PM  Other:  4/21/20173:06 PM  Other:  4/21/20173:06 PM  Other:  4/21/20173:06 PM  Other:   4/21/20173:06 PM   Scribe for Treatment Team:   Keene Breath, MSW, LCSW  10/09/2015, 3:06 PM

## 2015-11-13 ENCOUNTER — Other Ambulatory Visit: Payer: Self-pay | Admitting: Psychiatry
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
